# Patient Record
Sex: Male | Born: 1970 | Race: Black or African American | Hispanic: No | Marital: Married | Smoking: Current some day smoker
Health system: Southern US, Community
[De-identification: ages and names within clinical notes are randomized; demographics above are authoritative.]

## PROBLEM LIST (undated history)

## (undated) DIAGNOSIS — E785 Hyperlipidemia, unspecified: Secondary | ICD-10-CM

## (undated) DIAGNOSIS — G4710 Hypersomnia, unspecified: Secondary | ICD-10-CM

## (undated) DIAGNOSIS — J45909 Unspecified asthma, uncomplicated: Secondary | ICD-10-CM

## (undated) HISTORY — DX: Hyperlipidemia, unspecified: E78.5

## (undated) HISTORY — DX: Hypersomnia, unspecified: G47.10

## (undated) HISTORY — PX: APPENDECTOMY: SHX54

---

## 2002-11-19 ENCOUNTER — Emergency Department (HOSPITAL_COMMUNITY): Admission: EM | Admit: 2002-11-19 | Discharge: 2002-11-19 | Payer: Self-pay

## 2002-11-19 ENCOUNTER — Encounter: Payer: Self-pay | Admitting: *Deleted

## 2003-03-15 ENCOUNTER — Emergency Department (HOSPITAL_COMMUNITY): Admission: EM | Admit: 2003-03-15 | Discharge: 2003-03-15 | Payer: Self-pay | Admitting: Emergency Medicine

## 2004-01-22 ENCOUNTER — Emergency Department (HOSPITAL_COMMUNITY): Admission: EM | Admit: 2004-01-22 | Discharge: 2004-01-22 | Payer: Self-pay | Admitting: Emergency Medicine

## 2005-04-21 ENCOUNTER — Ambulatory Visit (HOSPITAL_BASED_OUTPATIENT_CLINIC_OR_DEPARTMENT_OTHER): Admission: RE | Admit: 2005-04-21 | Discharge: 2005-04-21 | Payer: Self-pay | Admitting: Urology

## 2005-04-21 ENCOUNTER — Encounter (INDEPENDENT_AMBULATORY_CARE_PROVIDER_SITE_OTHER): Payer: Self-pay | Admitting: Specialist

## 2005-10-04 ENCOUNTER — Ambulatory Visit: Payer: Self-pay | Admitting: Internal Medicine

## 2005-10-04 ENCOUNTER — Observation Stay (HOSPITAL_COMMUNITY): Admission: EM | Admit: 2005-10-04 | Discharge: 2005-10-06 | Payer: Self-pay | Admitting: Emergency Medicine

## 2005-10-05 ENCOUNTER — Encounter (INDEPENDENT_AMBULATORY_CARE_PROVIDER_SITE_OTHER): Payer: Self-pay | Admitting: Specialist

## 2006-05-07 ENCOUNTER — Emergency Department (HOSPITAL_COMMUNITY): Admission: EM | Admit: 2006-05-07 | Discharge: 2006-05-07 | Payer: Self-pay | Admitting: *Deleted

## 2006-06-29 ENCOUNTER — Emergency Department (HOSPITAL_COMMUNITY): Admission: EM | Admit: 2006-06-29 | Discharge: 2006-06-29 | Payer: Self-pay | Admitting: Emergency Medicine

## 2009-06-19 ENCOUNTER — Emergency Department (HOSPITAL_COMMUNITY): Admission: EM | Admit: 2009-06-19 | Discharge: 2009-06-19 | Payer: Self-pay | Admitting: Emergency Medicine

## 2009-08-27 ENCOUNTER — Emergency Department (HOSPITAL_COMMUNITY): Admission: EM | Admit: 2009-08-27 | Discharge: 2009-08-27 | Payer: Self-pay | Admitting: Emergency Medicine

## 2009-11-08 ENCOUNTER — Emergency Department (HOSPITAL_COMMUNITY): Admission: EM | Admit: 2009-11-08 | Discharge: 2009-11-08 | Payer: Self-pay | Admitting: Emergency Medicine

## 2010-10-28 NOTE — Consult Note (Signed)
NAME:  Nicholas Barajas, Nicholas Barajas NO.:  [id number]   MEDICAL RECORD NO.:  [id number]          PATIENT TYPE:  OBV   LOCATION:  3728                         FACILITY:  [facility]   PHYSICIAN:  Graylin Shiver, M.D.   DATE OF BIRTH:  03/11/1971   DATE OF CONSULTATION:  10/04/2005  DATE OF DISCHARGE:                                   CONSULTATION   REASON FOR CONSULTATION:  The patient is a 40 year old African-American male  who presents to the emergency room with complaints of rectal bleeding.  He  states he has been experiencing rectal bleeding for the past 2 years.  It is  been a dark red.  Over the last couple of days, the rectal bleeding has  increased and it worried him.  He came to the emergency room for further  evaluation.  He was seen and is going to be admitted.  His hemoglobin is  14.9.  He has no complaints of hematemesis.  He does have a little  indigestion and heartburn sometimes.  He has no history of peptic ulcer  disease.  No history of colitis.  He has never had his colon checked before.  He has just put up with this rectal bleeding over the past couple of years.   PAST HISTORY:  Medical problems asthma.   SURGERIES:  None.   ALLERGIES:  NONE KNOWN.   MEDICATIONS:  Some type of asthma medications.   SOCIAL HISTORY:  Smokes cigarettes, drinks alcohol.   REVIEW OF SYSTEMS:  No chest pain, no complaints of shortness of breath at  this time.   PHYSICAL:  VITAL SIGNS:  Stable.  No distress, nonicteric.  HEART:  Regular rhythm.  LUNGS:  Clear.  ABDOMEN:  Soft, nontender, no hepatosplenomegaly   IMPRESSION:  A 40 year old male with chronic rectal bleeding.  Hemoglobin is  normal.  Suspect he might have underlying inflammatory bowel disease such as  ulcerative proctitis or colitis.   PLAN:  Proceed with colonoscopy tomorrow to further evaluate.           ______________________________  Graylin Shiver, M.D.     SFG/MEDQ  D:  10/04/2005  T:  10/05/2005   Job:  [id number]

## 2010-10-28 NOTE — Op Note (Signed)
NAME:  Nicholas Barajas, Nicholas Barajas            ACCOUNT NO.:  [id number]   MEDICAL RECORD NO.:  [id number]          PATIENT TYPE:  AMB   LOCATION:  [facility]                         FACILITY:  [facility]   PHYSICIAN:  Sigmund I. Patsi Sears, M.D.DATE OF BIRTH:  1971/04/25   DATE OF PROCEDURE:  04/21/2005  DATE OF DISCHARGE:                                 OPERATIVE REPORT   PREOPERATIVE DIAGNOSIS:  Multiple recurrent penile warts.   POSTOPERATIVE DIAGNOSIS:  Multiple recurrent penile warts.   OPERATION:  Excision and CO2 laser of penile warts.   SURGEON:  Sigmund I. Patsi Sears, M.D.   ANESTHESIA:  General LMA.   PREPARATION:  After appropriate preanesthesia, the patient was brought to  the operating room, placed on the operating table in dorsal supine position  where general LMA anesthesia was introduced. The penis was prepped and  draped in the usual fashion.   Excision of the penile warts was then accomplished from the distal penile  shaft, and from the ventral penile shaft. The areas were then lased with a  CO2 laser. The wounds were then closed in one and two layers with 4-0  Monocryl suture. The patient tolerated the procedure well. A sterile  dressing was applied and the patient was awakened and taken to the recovery  room in good condition.      Sigmund I. Patsi Sears, M.D.  Electronically Signed     SIT/MEDQ  D:  04/21/2005  T:  04/22/2005  Job:  [id number]

## 2010-10-28 NOTE — Discharge Summary (Signed)
NAME:  Nicholas Barajas, Nicholas Barajas NO.:  [id number]   MEDICAL RECORD NO.:  [id number]          PATIENT TYPE:  OBV   LOCATION:  3728                         FACILITY:  [facility]   PHYSICIAN:  Ellie Lunch, M.D.      DATE OF BIRTH:  [birth year]   DATE OF ADMISSION:  10/04/2005  DATE OF DISCHARGE:  10/06/2005                                 DISCHARGE SUMMARY   RESIDENT:  Ellie Lunch, M.D.   He will be following up with Dr. Evette Cristal, a [facility] physician, and the  patient has no primary care physician.   DISCHARGE DIAGNOSIS:  Internal hemorrhoid.   DISCHARGE MEDICATIONS:  1.  Anusol HC b.i.d.  2.  Albuterol inhaler one to two puffs p.r.n.  3.  Metamucil OTC.   FOLLOWUP:  They will need to check if the patient is still bleeding after  three to four weeks after discharge, and if this is the case, he has been  instructed by his GI physician, Dr. Evette Cristal, to be referred to a surgeon to  repair his internal hemorrhoids.  His hemoglobin should also be checked to  see if it has decreased with continued bleeding.   PROCEDURES PERFORMED IN HOSPITAL:  Colonoscopy.  The results of this  colonoscopy including a diagnosis of an internal hemorrhoid, otherwise, the  colonoscopy was normal.  A biopsy was also taken from his rectum.  The  biopsy results will be followed up with the patient by the GI physician, Dr.  Evette Cristal.   CONSULTATIONS:  [facility] physician, Dr. Evette Cristal.   HISTORY OF PRESENT ILLNESS:  This 40 year old African-American male  presented to the ER with a long-standing history of rectal bleeding,  constipation, diarrhea, and abdominal cramping with a one month history of  increased bleeding and diarrhea, weakness, and lightheadedness.  He states  that the blood now fills the toilet bowl every time he uses the restroom,  and has diarrhea after every meal.  He has had similar episodes in the past,  but they are becoming more frequent.  He states that 1-1/2 years ago he  states he had a  two month episode similar to this one with frequent bloody  diarrhea, abdominal cramping, and decreased appetite.  He lost approximately  40 pounds at that time that he has slowly regained to a weight of  approximately 200 pounds.  He reports that his fatigue is getting worse and  feels that he needs to sleep a lot more which is unusual and frustrating to  the patient because he has always been athletic and enjoys physical  activity.  The episodes of severe bleeding and diarrhea have occurred  intermittently for at least the past three years, and have become increasing  in frequency.  There has been no period of time when there was not at least  some blood on the toilet paper.  He also has abdominal cramping which can be  severe at times.  He does report he has felt more short of breath, but is  unsure if that is due to __________.  He also states he has swelling and  pain  in both of his feet for a month or so.  Also very significant, is that  each time he stools he feels something inside of him that comes out and  gets stuck, which he then must use his finger to push back in.   ALLERGIES:  No known drug allergies.   PAST MEDICAL HISTORY:  1.  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  2.  He states he also had severe constipation while growing up since he was      a baby.  That was treated with enemas until age 65.  He then managed his      constipation through his 58s with some difficulty.  He had mild bleeding      during this entire period of his life.  For the past two years, the      problems have been with increased diarrhea, compared to constipation,      and with increased blood.  3.  Asthma.  4.  Appendectomy in his early teens.  5.  Inguinal hernia non-surgically reversed in 1994.  6.  Hospitalization in 1992, in [place] for approximately one month with      persistent high fever, nausea, and vomiting.  He states he had multiple      tests, but did  not have a colonoscopy during that admission.   MEDICATIONS:  His only regular medication includes albuterol for acute  asthma that he uses approximately two times per month unless he has an acute  exacerbation which he did have two weeks prior to admission.   SOCIAL HISTORY:  He is a current smoker.  He smokes five filtered cigars per  day and has for the past four years.  Prior to that time, he smoked  cigarettes occasionally.  He drinks one to two alcoholic beverages every day  with his maximum drink in one day in the past month being approximately  eight drinks.  He does not use cocaine, he does not use IV drugs.  He is  married, and works as a Merchandiser, retail.  He has health insurance with [employer].  He lives with his wife and three children.   FAMILY HISTORY:  His mother had ovarian cancer and hypertension.  She is  living.  His father had asthma and died of a MI at age 60.  His aunt had  ulcerative colitis.  His siblings include one healthy brother.  His  grandmother had diabetes mellitus.   REVIEW OF SYSTEMS:  Significant for weight loss, fatigue, chest pain with  his asthma, and shortness of breath with his asthma.  He also snores very  loudly.  He has had diarrhea and constipation, blood in his stool, as well  as black stools.  He has had decreased appetite and abdominal pain which he  describes is all over.  He also has joint pain in his feet.  He has had  weakness and imbalance, but he thinks is due to lightheadedness.   PHYSICAL EXAMINATION:  VITAL SIGNS:  Temperature of 97.9, blood pressure of  146/95, pulse 78, respirations 20, oxygen saturation 97% on room air.  His  weight was 201.  LUNGS:  Clear to auscultation bilaterally.  CARDIOVASCULAR:  Regular rate and rhythm with no murmurs, rubs, or gallops.  Radial, popliteal, dorsalis pedis pulses 2+.  Capillary refill was brisk, no  signs of pallor. ABDOMEN:  Soft, nondistended.  Tender below the umbilicus with hypoactive   bowel sounds.  RECTAL:  Significant for no fissures.  There was tenderness with deep  palpation.  An appendectomy scar was noted.  EXTREMITIES:  His feet were not swollen or tender on examination.   LABORATORY DATA:  Sodium of 138, potassium 3.4, chloride 107, bicarbonate  27, BUN 7, creatinine 1, glucose 92.  Hemoglobin 14.9, white blood cell  count 6.4, platelets 177.  ESR of 3.  MCV of 86.  Albumin of 3.5.  PT of  12.6, INR of 0.9, PTT of 29.  His total bilirubin was 0.7, alkaline  phosphatase 69, AST 23, ALT 21, total protein 6.2, albumin 3.5, calcium 8.6,  magnesium of 2.2.  His ferritin was 101.  His urinalysis was within normal  limits.  His fecal occult blood test was positive.   HOSPITAL COURSE:  PROBLEM #1 -  RECTAL BLEEDING:  The patient was admitted  to a telemetry bed and started on two large bore IV and given fluids of  normal saline at 125 cc/hour.  He also received CBC's q.8h. to monitor for  blood loss.  GI was consulted, and [facility] physician, Dr. Evette Cristal, saw the  patient and immediately recommended that the patient have a bowel prep so  that he could have a colonoscopy the morning after admission.  On October 05, 2005, the patient received a colonoscopy.  The results of the colonoscopy  included a diagnosis of an internal hemorrhoid, and otherwise normal colon,  and a biopsy was taken of his rectum.  The patient was continued to be  monitored with q.8h. CBC's and his hemoglobin was stable throughout  admission with admission hemoglobin of 14.9, and 15.3, 15.1, 14.3, 14.7, so  the patient had a stable hemoglobin throughout his admission.  The morning  of October 06, 2005, the patient was deemed to be stable.  Dr. Evette Cristal requested  to see the patient in followup in three weeks from discharge.   DISCHARGE LABORATORY DATA:  White blood count of 6.1, hemoglobin of 14.3,  hematocrit of 41.9, MCV of 89, platelet count of 173.  Neutrophils 49,  lymphocytes 36, monocytes 5,  eosinophils 9, basophils 1.     ______________________________  Lindaann Slough    ______________________________  Ellie Lunch, M.D.    KA/MEDQ  D:  10/06/2005  T:  10/07/2005  Job:  [id number]

## 2010-10-28 NOTE — Op Note (Signed)
NAME:  Nicholas Barajas, SHEVLIN NO.:  [id number]   MEDICAL RECORD NO.:  [id number]          PATIENT TYPE:  OBV   LOCATION:  3728                         FACILITY:  [facility]   PHYSICIAN:  Graylin Shiver, M.D.   DATE OF BIRTH:  04/28/1971   DATE OF PROCEDURE:  10/05/2005  DATE OF DISCHARGE:                                 OPERATIVE REPORT   INDICATIONS FOR PROCEDURE:  Diarrhea, rectal bleeding.   Informed consent was obtained after explanation of the risks of bleeding,  infection and perforation.   PREMEDICATION:  See medication sheet.   PROCEDURE:  With the patient in the left lateral decubitus position, a  rectal exam was performed.  No masses were felt.  The Olympus colonoscope  was inserted into the rectum and advanced around the colon to the cecum.  Cecal landmarks were identified. The cecum and ascending colon were normal.  The transverse colon normal.  The descending, sigmoid and rectum were  normal.  The scope was retroflexed in the rectum, and I saw an internal  hemorrhoid. The scope was then straightened, and a couple of biopsies from  the rectum were obtained for histology  because of the history of diarrhea.  He tolerated the procedure well without complications.   IMPRESSION:  Internal hemorrhoids which I believe is the cause of bleeding.  Biopsies of the rectum will be checked to see if anything shows up that  might explain his diarrhea.           ______________________________  Graylin Shiver, M.D.     SFG/MEDQ  D:  10/05/2005  T:  10/06/2005  Job:  [id number]   cc:   Madaline Guthrie, M.D.  Fax: [phone]

## 2012-04-11 ENCOUNTER — Other Ambulatory Visit: Payer: Self-pay | Admitting: Physician Assistant

## 2012-04-11 ENCOUNTER — Ambulatory Visit
Admission: RE | Admit: 2012-04-11 | Discharge: 2012-04-11 | Disposition: A | Discharge status: Home / Self Care | Payer: Worker's Compensation | Source: Ambulatory Visit | Attending: Physician Assistant | Admitting: Physician Assistant

## 2012-04-11 DIAGNOSIS — M545 Low back pain: Secondary | ICD-10-CM

## 2013-01-06 ENCOUNTER — Emergency Department (HOSPITAL_COMMUNITY): Payer: Self-pay

## 2013-01-06 ENCOUNTER — Emergency Department (HOSPITAL_COMMUNITY)
Admission: EM | Admit: 2013-01-06 | Discharge: 2013-01-07 | Disposition: A | Discharge status: Home / Self Care | Payer: Self-pay | Attending: Emergency Medicine | Admitting: Emergency Medicine

## 2013-01-06 ENCOUNTER — Encounter (HOSPITAL_COMMUNITY): Payer: Self-pay | Admitting: *Deleted

## 2013-01-06 DIAGNOSIS — M79672 Pain in left foot: Secondary | ICD-10-CM

## 2013-01-06 DIAGNOSIS — M25579 Pain in unspecified ankle and joints of unspecified foot: Secondary | ICD-10-CM | POA: Insufficient documentation

## 2013-01-06 DIAGNOSIS — Z79899 Other long term (current) drug therapy: Secondary | ICD-10-CM | POA: Insufficient documentation

## 2013-01-06 DIAGNOSIS — J45909 Unspecified asthma, uncomplicated: Secondary | ICD-10-CM | POA: Insufficient documentation

## 2013-01-06 HISTORY — DX: Unspecified asthma, uncomplicated: J45.909

## 2013-01-06 NOTE — ED Notes (Signed)
Pt is unable to bear his full body weight on his left foot. Pt denies any numbness and tingling. Pt states he has full sensation in his left foot.

## 2013-01-06 NOTE — ED Notes (Signed)
Pt in c/o pain to left foot, states pain is intermittent and it feels like a needle is sticking his foot, states the pain has been going on for a month

## 2013-01-06 NOTE — ED Provider Notes (Signed)
CSN: [id number]     Arrival date & time 01/06/13  1935 History    This chart was scribed for non-physician practitioner Junious Silk PA-C, working with Bonnita Levan. Bernette Mayers, MD by Donne Anon, ED Scribe. This patient was seen in room TR06C/TR06C and the patient's care was started at 2148.   First MD Initiated Contact with Patient 01/06/13 2148     Chief Complaint  Patient presents with  . Foot Pain    The history is provided by the patient. No language interpreter was used.   HPI Comments: Nicholas Barajas is a 42 y.o. male who presents to the Emergency Department complaining of 1 month of gradual onset, gradually worsening, intermittent left foot pain that worsened over the past week and is described as a sharp and stabbing. He has this pain about several times a day and it usually last for a few seconds. He is unsure what triggers the pain. He has tried Aleve with little relief. He states he injured his left ankle 7 years ago and never sought medical advice. At that time he twisted it and got a boot, which resolved his pain. He denies recent injury or trauma. He is concerned it may be DM but has never had his blood sugar measured before.     Past Medical History  Diagnosis Date  . Asthma    History reviewed. No pertinent past surgical history. History reviewed. No pertinent family history. History  Substance Use Topics  . Smoking status: Not on file  . Smokeless tobacco: Not on file  . Alcohol Use: Not on file    Review of Systems  Musculoskeletal: Positive for myalgias.  All other systems reviewed and are negative.    Allergies  Review of patient's allergies indicates no known allergies.  Home Medications   Current Outpatient Rx  Name  Route  Sig  Dispense  Refill  . albuterol (PROVENTIL HFA;VENTOLIN HFA) 108 (90 BASE) MCG/ACT inhaler   Inhalation   Inhale 2 puffs into the lungs every 6 (six) hours as needed for wheezing.         . carisoprodol (SOMA) 350 MG  tablet   Oral   Take 350 mg by mouth 4 (four) times daily as needed for muscle spasms.         . traMADol (ULTRAM) 50 MG tablet   Oral   Take 50 mg by mouth every 6 (six) hours as needed for pain.          BP 130/84  Pulse 66  Temp(Src) 98 F (36.7 C) (Oral)  Resp 18  SpO2 98%  Physical Exam  Nursing note and vitals reviewed. Constitutional: He is oriented to person, place, and time. He appears well-developed and well-nourished. No distress.  HENT:  Head: Normocephalic and atraumatic.  Right Ear: External ear normal.  Left Ear: External ear normal.  Nose: Nose normal.  Eyes: Conjunctivae are normal.  Neck: Normal range of motion. No tracheal deviation present.  Cardiovascular: Normal rate, regular rhythm, normal heart sounds and intact distal pulses.   Pulmonary/Chest: Effort normal and breath sounds normal. No stridor.  Abdominal: Soft. He exhibits no distension. There is no tenderness.  Musculoskeletal: Normal range of motion.  Strength normal. Neurovascularly intact. Capillary refill <3 seconds. No rashes. Entire right foot not tender to palpation.  Neurological: He is alert and oriented to person, place, and time.  Skin: Skin is warm and dry. He is not diaphoretic.  Psychiatric: He has a normal mood and affect.  His behavior is normal.    ED Course   Procedures (including critical care time) DIAGNOSTIC STUDIES: Oxygen Saturation is 98% on RA, normal by my interpretation.    COORDINATION OF CARE: 11:30 PM Discussed treatment plan which includes labs and foot xray with pt at bedside and pt agreed to plan. Will give PCP resource guide.    Labs Reviewed  POCT I-STAT, CHEM 8   Dg Foot Complete Left  01/07/2013   *RADIOLOGY REPORT*  Clinical Data: Foot pain.  LEFT FOOT - COMPLETE 3+ VIEW  Comparison: None.  Findings: Anatomic alignment bones of the left foot.  There is no fracture.  The soft tissues appear within normal limits.  Joint spaces preserved.  IMPRESSION:  Negative.   Original Report Authenticated By: Andreas Newport, M.D.   1. Foot pain, left     MDM  Patient with left foot pain x months. XR is negative. Chem 8 WNL. Compartment soft. Neurovascularly intact. He was given a resource guide to establish care with PCP. Return instructions given. Vital signs stable for discharge. Patient / Family / Caregiver informed of clinical course, understand medical decision-making process, and agree with plan.   I personally performed the services described in this documentation, which was scribed in my presence. The recorded information has been reviewed and is accurate.     Mora Bellman, PA-C 01/07/13 0127

## 2013-01-07 LAB — POCT I-STAT, CHEM 8
Chloride: 103 mEq/L (ref 96–112)
HCT: 44 % (ref 39.0–52.0)
Potassium: 3.5 mEq/L (ref 3.5–5.1)

## 2013-01-07 MED ORDER — PROMETHAZINE HCL 25 MG PO TABS: 25.0000 mg | ORAL_TABLET | Freq: Four times a day (QID) | ORAL | Status: DC | PRN

## 2013-01-07 MED ORDER — HYDROCODONE-ACETAMINOPHEN 5-325 MG PO TABS: 2.0000 | ORAL_TABLET | Freq: Four times a day (QID) | ORAL | Status: DC | PRN

## 2013-01-07 NOTE — ED Notes (Signed)
Phlebotomy at bedside.

## 2013-01-07 NOTE — ED Provider Notes (Signed)
Medical screening examination/treatment/procedure(s) were performed by non-physician practitioner and as supervising physician I was immediately available for consultation/collaboration.   Charles B. Sheldon, MD 01/07/13 0207 

## 2013-12-21 IMAGING — CR DG LUMBAR SPINE COMPLETE 4+V
5 series · 5 of 5 positions shown · non-contrast
Comparison: None.

CLINICAL DATA: Injured at work lifting heavy fracture, left-sided
back pain

LUMBAR SPINE - COMPLETE 4+ VIEW

[view not recorded (1 of 5)]
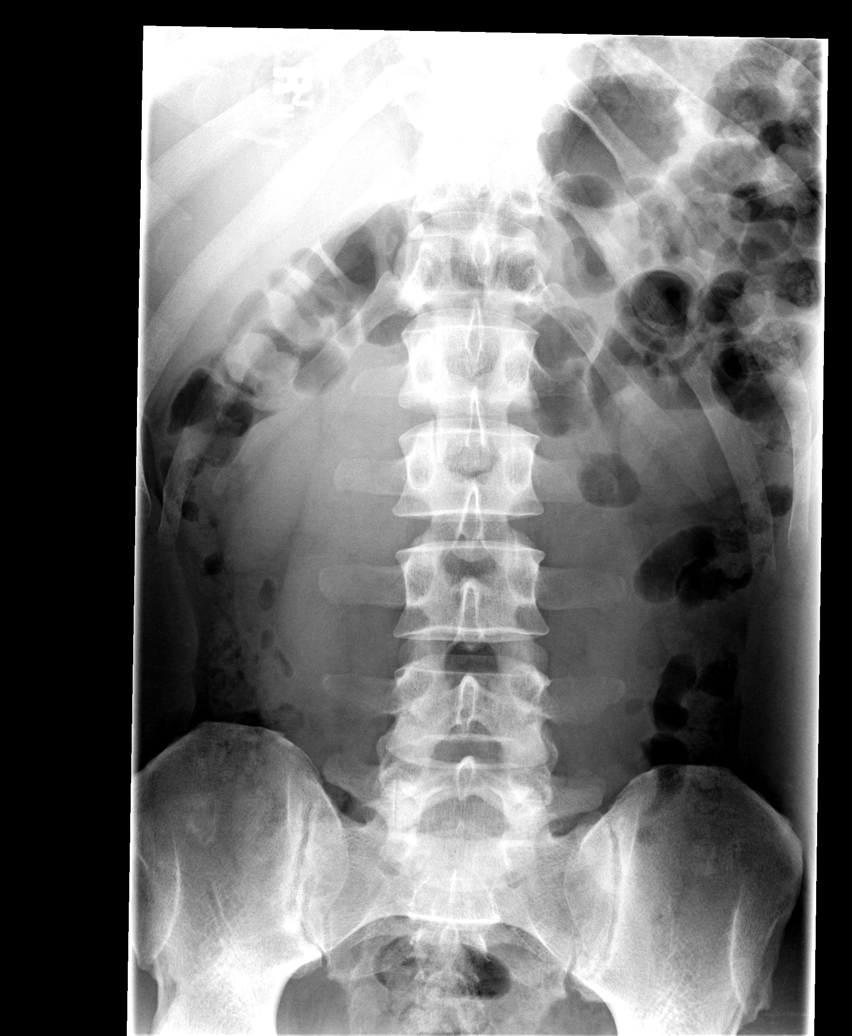

[view not recorded (2 of 5)]
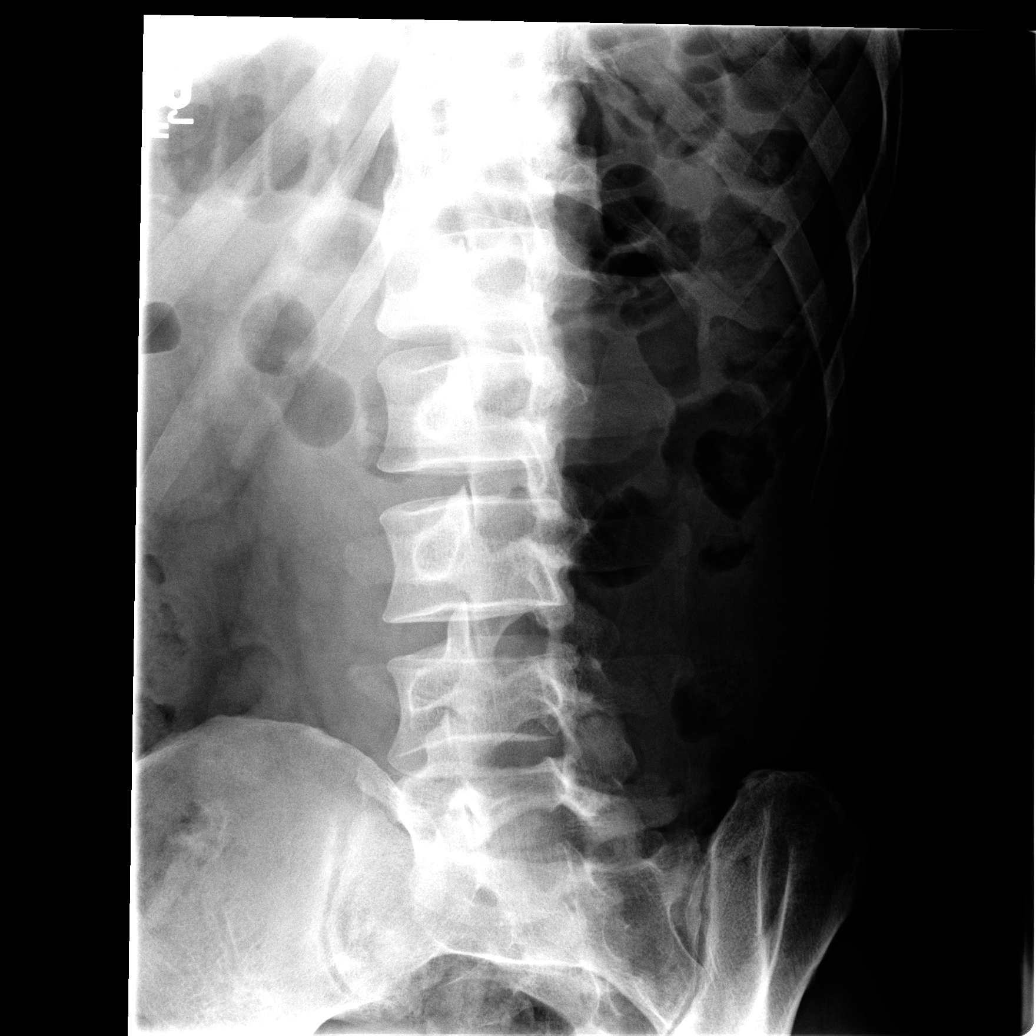

[view not recorded (3 of 5)]
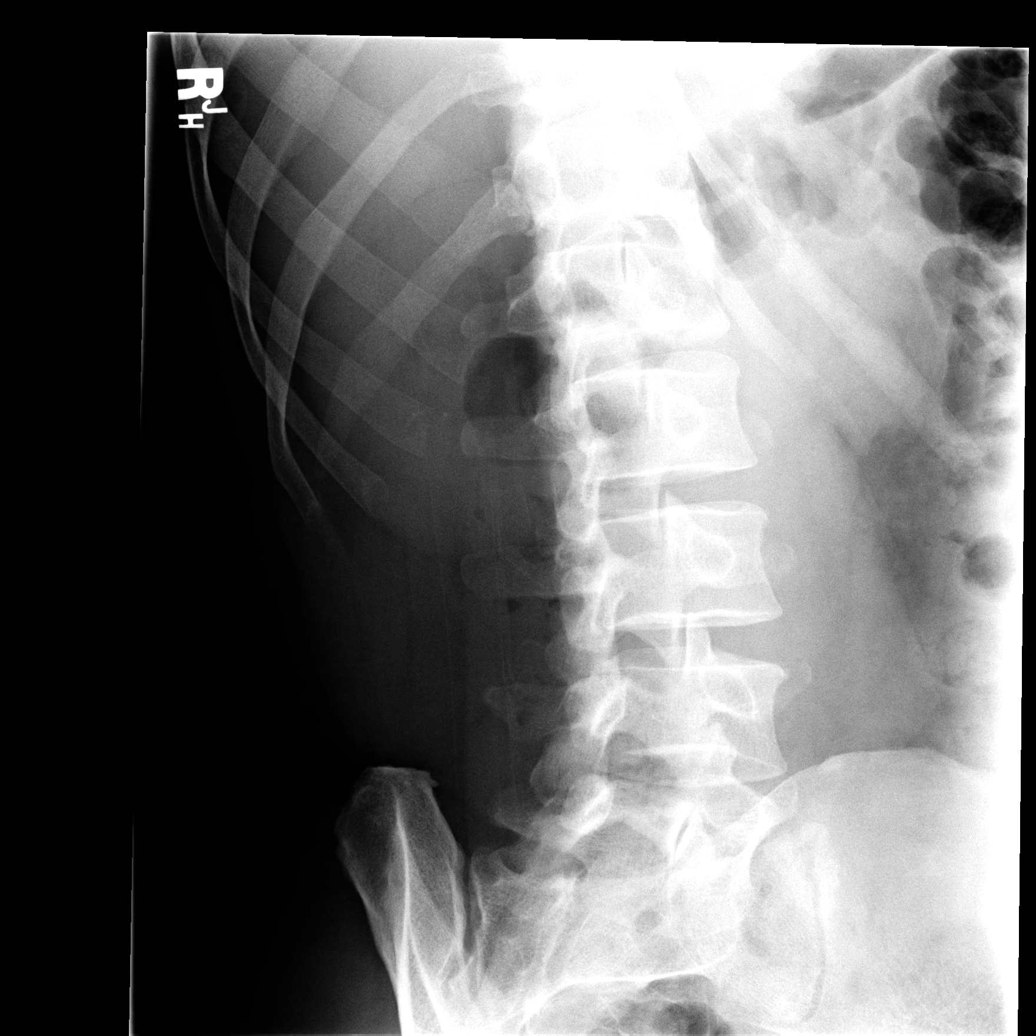

[view not recorded (4 of 5)]
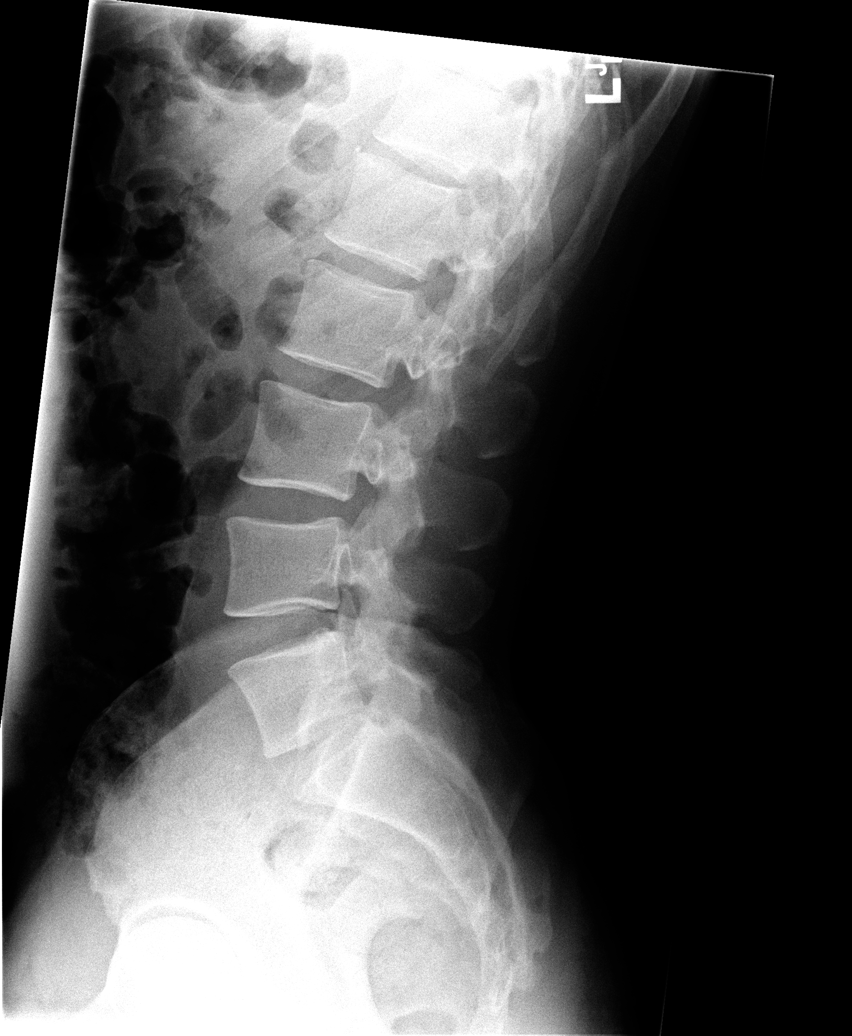

[view not recorded (5 of 5)]
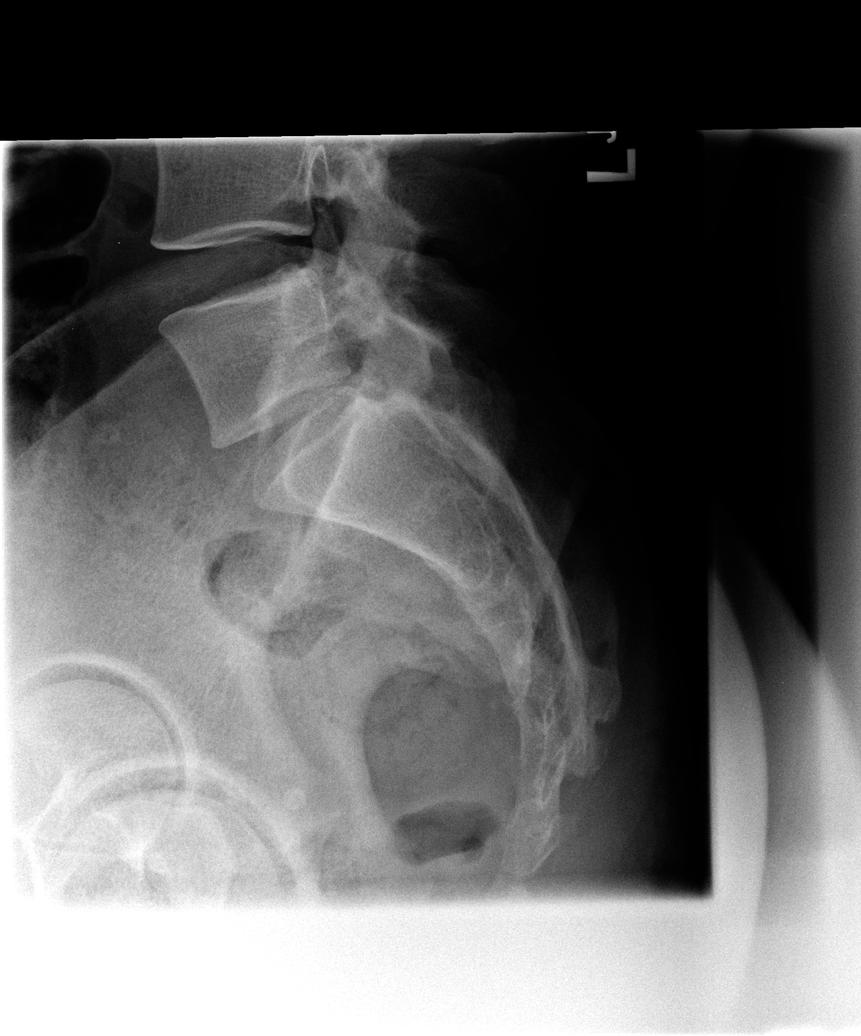

[5 of 5 positions shown; findings below may reference images not displayed]

FINDINGS: The lumbar vertebrae are in normal alignment.
Intervertebral disc spaces appear normal.  No degenerative change
is seen.  The SI joints appear normal.
IMPRESSION: Normal alignment.  Normal disc spaces.  No acute abnormality.

## 2015-02-01 ENCOUNTER — Encounter: Payer: Self-pay | Admitting: Neurology

## 2015-02-01 ENCOUNTER — Ambulatory Visit (INDEPENDENT_AMBULATORY_CARE_PROVIDER_SITE_OTHER): Payer: BLUE CROSS/BLUE SHIELD | Admitting: Neurology

## 2015-02-01 VITALS — BP 120/90 | HR 72 | Resp 18 | Ht 70.0 in | Wt 226.0 lb

## 2015-02-01 DIAGNOSIS — G4719 Other hypersomnia: Secondary | ICD-10-CM

## 2015-02-01 DIAGNOSIS — R51 Headache: Secondary | ICD-10-CM | POA: Diagnosis not present

## 2015-02-01 DIAGNOSIS — G2581 Restless legs syndrome: Secondary | ICD-10-CM

## 2015-02-01 DIAGNOSIS — R519 Headache, unspecified: Secondary | ICD-10-CM

## 2015-02-01 DIAGNOSIS — R351 Nocturia: Secondary | ICD-10-CM

## 2015-02-01 DIAGNOSIS — G4733 Obstructive sleep apnea (adult) (pediatric): Secondary | ICD-10-CM

## 2015-02-01 NOTE — Patient Instructions (Signed)

## 2015-02-01 NOTE — Progress Notes (Signed)
Subjective:    Patient ID: Nicholas Barajas is a 44 y.o. male.  HPI     Nicholas Barajas, Nicholas Barajas, Nicholas Barajas [facility] [address], [address] [address] [place], [place] [address]  Dear Nicholas Barajas,   I saw your patient, Nicholas Barajas, upon your kind request in my neurologic clinic today for initial consultation of his sleep disorder, in particular, concern for obstructive sleep apnea. The patient is unaccompanied today. As you know, Nicholas Barajas is a 44 year old right-handed gentleman with an underlying medical history of hypertension, smoking, and obesity, who reports snoring, witnessed apneas per wife, and excessive daytime somnolence. His Epworth sleepiness score is 16 out of 24 today, his fatigue scores 45 out of 63. He often has morning headaches which are dull and achy and in the front typically. This happens 3-4 times per week. He has nocturia typically once per night. He works 10:30 AM to 7 PM. His bedtime is around 11 PM and rise time is around 6 AM. He does not typically wake up rested. After his wife leaves for work he typically goes back to bed around 645 and gets up around 9. He works as a Scientist, clinical (histocompatibility and immunogenetics). He has been trying to lose weight and has cut down on his sodas. Currently he drinks one soda per day. He does not drink coffee. He drinks alcohol occasionally and smokes about 2 cigarettes per day. He has quit altogether in the past but continues to smoke a few cigarettes a day. He lives with his wife, 44 year old son and 65 year old daughter. His mother has a history of snoring. He also endorses occasional restless leg symptoms but is not sure if he twitches his legs in his sleep. Often he sleeps in a different room because of loud snoring and apneic pauses reported which disturb his wife's sleep.  His Past Medical History Is Significant For: Past Medical History  Diagnosis Date  . Asthma   . Hypersomnia   . Hyperlipemia     His Past Surgical History Is  Significant For: Past Surgical History  Procedure Laterality Date  . Appendectomy      His Family History Is Significant For: Family History  Problem Relation Age of Onset  . Heart attack Father     His Social History Is Significant For: Social History   Social History  . Marital Status: Married    Spouse Name: N/A  . Number of Children: 2  . Years of Education: HS   Occupational History  . [employer]     Social History Main Topics  . Smoking status: Current Some Day Smoker  . Smokeless tobacco: None  . Alcohol Use: 0.0 oz/week    0 Standard drinks or equivalent per week  . Drug Use: No  . Sexual Activity: Not Asked   Other Topics Concern  . None   Social History Narrative   Drinks about 1 soda a day     His Allergies Are:  No Known Allergies:   His Current Medications Are:  Outpatient Encounter Prescriptions as of 02/01/2015  Medication Sig  . modafinil (PROVIGIL) 200 MG tablet Take 200 mg by mouth every morning.   No facility-administered encounter medications on file as of 02/01/2015.  :  Review of Systems:  Out of a complete 14 point review of systems, all are reviewed and negative with the exception of these symptoms as listed below:   Review of Systems  Constitutional: Positive for fatigue.  Respiratory:  Snoring   Neurological:       No trouble falling or staying asleep, snoring, witnessed apnea, wakes up feeling tired, daytime tiredness, constant change in work schedule, morning headaches, denies taking naps.    Psychiatric/Behavioral:       Too much sleep, disinterest in activities    Objective:  Neurologic Exam  Physical Exam Physical Examination:   Filed Vitals:   02/01/15 0937  BP: 120/90  Pulse: 72  Resp: 18   General Examination: The patient is a very pleasant 44 y.o. male in no acute distress. He appears well-developed and well-nourished and well groomed.   HEENT: Normocephalic, atraumatic, pupils are equal,  round and reactive to light and accommodation. Funduscopic exam is normal with sharp disc margins noted. Extraocular tracking is good without limitation to gaze excursion or nystagmus noted. Normal smooth pursuit is noted. Hearing is grossly intact. Tympanic membranes are clear bilaterally. Face is symmetric with normal facial animation and normal facial sensation. Speech is clear with no dysarthria noted. There is no hypophonia. There is no lip, neck/head, jaw or voice tremor. Neck is supple with full range of passive and active motion. There are no carotid bruits on auscultation. Oropharynx exam reveals: mild mouth dryness, adequate dental hygiene and marked airway crowding, due to larger tongue, thicker soft palate with enlarged uvula, and large tonsils of about 3+. Mallampati is class II. Tongue protrudes centrally and palate elevates symmetrically. Neck size is 16 inches. He has a Mild overbite. Nasal inspection reveals  no significant nasal mucosal bogginess or redness and no septal deviation.   Chest: Clear to auscultation without wheezing, rhonchi or crackles noted.  Heart: S1+S2+0, regular and normal without murmurs, rubs or gallops noted.   Abdomen: Soft, non-tender and non-distended with normal bowel sounds appreciated on auscultation.  Extremities: There is no pitting edema in the distal lower extremities bilaterally. Pedal pulses are intact.  Skin: Warm and dry without trophic changes noted. There are no varicose veins.  Musculoskeletal: exam reveals no obvious joint deformities, tenderness or joint swelling or erythema.   Neurologically:  Mental status: The patient is awake, alert and oriented in all 4 spheres. His immediate and remote memory, attention, language skills and fund of knowledge are appropriate. There is no evidence of aphasia, agnosia, apraxia or anomia. Speech is clear with normal prosody and enunciation. Thought process is linear. Mood is normal and affect is normal.   Cranial nerves II - XII are as described above under HEENT exam. In addition: shoulder shrug is normal with equal shoulder height noted. Motor exam: Normal bulk, strength and tone is noted. There is no drift, tremor or rebound. Romberg is negative. Reflexes are 2+ throughout. Babinski: Toes are flexor bilaterally. Fine motor skills and coordination: intact with normal finger taps, normal hand movements, normal rapid alternating patting, normal foot taps and normal foot agility.  Cerebellar testing: No dysmetria or intention tremor on finger to nose testing. Heel to shin is unremarkable bilaterally. There is no truncal or gait ataxia.  Sensory exam: intact to light touch, pinprick, vibration, temperature sense in the upper and lower extremities.  Gait, station and balance: He stands easily. No veering to one side is noted. No leaning to one side is noted. Posture is age-appropriate and stance is narrow based. Gait shows normal stride length and normal pace. No problems turning are noted. He turns en bloc. Tandem walk is unremarkable.   Assessment and Plan:    In summary, Nicholas Barajas is a very pleasant  44 y.o.-year old male with an underlying medical history of hypertension, smoking, and obesity, who reports snoring, witnessed apneas per wife, and excessive daytime somnolence. In addition, he reports morning headaches, restless leg symptoms and nocturia. His history and physical exam are in keeping with obstructive sleep apnea (OSA). I had a long chat with the patient about my findings and the diagnosis of OSA, its prognosis and treatment options. We talked about medical treatments, surgical interventions and non-pharmacological approaches. I explained in particular the risks and ramifications of untreated moderate to severe OSA, especially with respect to developing cardiovascular disease down the Road, including congestive heart failure, difficult to treat hypertension, cardiac arrhythmias, or  stroke. Even type 2 diabetes has, in part, been linked to untreated OSA. Symptoms of untreated OSA include daytime sleepiness, memory problems, mood irritability and mood disorder such as depression and anxiety, lack of energy, as well as recurrent headaches, especially morning headaches. We talked about smoking cessation and trying to maintain a healthy lifestyle in general, as well as the importance of weight control. I encouraged the patient to eat healthy, exercise daily and keep well hydrated, to keep a scheduled bedtime and wake time routine, to not skip any meals and eat healthy snacks in between meals. I advised the patient not to drive when feeling sleepy. I recommended the following at this time: sleep study with potential positive airway pressure titration. (We will score hypopneas at 3% and split the sleep study into diagnostic and treatment portion, if the estimated. 2 hour AHI is >15/h).   I explained the sleep test procedure to the patient and also outlined possible surgical and non-surgical treatment options of OSA, including the use of a custom-made dental device (which would require a referral to a specialist dentist or oral surgeon), upper airway surgical options, such as pillar implants, radiofrequency surgery, tongue base surgery, and UPPP (which would involve a referral to an ENT surgeon). Rarely, jaw surgery such as mandibular advancement may be considered.  I also explained the CPAP treatment option to the patient, who indicated that he would be reluctant, willing to try CPAP if the need arises. I explained the importance of being compliant with PAP treatment, not only for insurance purposes but primarily to improve His symptoms, and for the patient's long term health benefit, including to reduce His cardiovascular risks. I answered all his questions today and the patient was in agreement. I would like to see him back after the sleep study is completed and encouraged him to call with any  interim questions, concerns, problems or updates.   Thank you very much for allowing me to participate in the care of this nice patient. If I can be of any further assistance to you please do not hesitate to call me at [phone].  Sincerely,   Nicholas Barajas, Nicholas Barajas, Nicholas Barajas

## 2015-03-19 ENCOUNTER — Ambulatory Visit (INDEPENDENT_AMBULATORY_CARE_PROVIDER_SITE_OTHER): Payer: BLUE CROSS/BLUE SHIELD | Admitting: Neurology

## 2015-03-19 DIAGNOSIS — G4733 Obstructive sleep apnea (adult) (pediatric): Secondary | ICD-10-CM | POA: Diagnosis not present

## 2015-03-19 NOTE — Sleep Study (Signed)
Please see the scanned sleep study interpretation located in the Procedure tab within the Chart Review section. 

## 2015-03-22 ENCOUNTER — Telehealth: Payer: Self-pay | Admitting: Neurology

## 2015-03-22 DIAGNOSIS — G4733 Obstructive sleep apnea (adult) (pediatric): Secondary | ICD-10-CM

## 2015-03-22 NOTE — Telephone Encounter (Signed)
Diana:   Patient referred by PCP, seen by me on 02/01/15, split study on 03/19/15, Ins: [employer].  Please call and notify patient that the recent sleep study confirmed the diagnosis of severe OSA. He did well with CPAP during the study with significant improvement of the respiratory events. Therefore, I would like start the patient on CPAP therapy at home by prescribing a machine for home use. I placed the order in the chart. The patient will need a follow up appointment with me in 8 to 10 weeks post set up that has to be scheduled; please go ahead and schedule while you have the patient on the phone and make sure patient understands the importance of keeping this window for the FU appointment, as it is often an insurance requirement and failing to adhere to this may result in losing coverage for sleep apnea treatment. 15 min follow-up should suffice, unless there is a 30 min FU slot available.  Please re-enforce the importance of compliance with treatment and the need for [place] to monitor compliance data - again an insurance requirement and good feedback for the patient as far as how they are doing.  Also remind patient, that any upcoming CPAP machine or mask issues, should be first addressed with the DME company. Please ask if patient has a preference regarding DME company.  Please arrange for CPAP set up at home through a DME company of patient's choice - once you have spoken to the patient - and faxed/routed report to PCP and referring MD (if other than PCP), you can close this encounter, thanks,   Huston Foley, MD, PhD [facility] ([facility])

## 2015-03-22 NOTE — Telephone Encounter (Signed)
I spoke to patient and gave him results. He would like to proceed with CPAP treatment. I will refer patient out. I will fax report to PCP. And send patient a letter reminding him to make appt and stress the importance of compliance.

## 2015-06-29 ENCOUNTER — Emergency Department (HOSPITAL_COMMUNITY)
Admission: EM | Admit: 2015-06-29 | Discharge: 2015-06-29 | Disposition: A | Discharge status: Home / Self Care | Payer: BLUE CROSS/BLUE SHIELD | Attending: Emergency Medicine | Admitting: Emergency Medicine

## 2015-06-29 ENCOUNTER — Encounter (HOSPITAL_COMMUNITY): Payer: Self-pay

## 2015-06-29 DIAGNOSIS — J039 Acute tonsillitis, unspecified: Secondary | ICD-10-CM | POA: Diagnosis not present

## 2015-06-29 DIAGNOSIS — F17200 Nicotine dependence, unspecified, uncomplicated: Secondary | ICD-10-CM | POA: Diagnosis not present

## 2015-06-29 DIAGNOSIS — Z79899 Other long term (current) drug therapy: Secondary | ICD-10-CM | POA: Insufficient documentation

## 2015-06-29 DIAGNOSIS — Z8639 Personal history of other endocrine, nutritional and metabolic disease: Secondary | ICD-10-CM | POA: Insufficient documentation

## 2015-06-29 DIAGNOSIS — G4710 Hypersomnia, unspecified: Secondary | ICD-10-CM | POA: Insufficient documentation

## 2015-06-29 DIAGNOSIS — J45909 Unspecified asthma, uncomplicated: Secondary | ICD-10-CM | POA: Diagnosis not present

## 2015-06-29 DIAGNOSIS — R221 Localized swelling, mass and lump, neck: Secondary | ICD-10-CM | POA: Diagnosis present

## 2015-06-29 LAB — RAPID STREP SCREEN (MED CTR MEBANE ONLY): Streptococcus, Group A Screen (Direct): NEGATIVE

## 2015-06-29 MED ORDER — DEXAMETHASONE 4 MG PO TABS
10.0000 mg | ORAL_TABLET | Freq: Once | ORAL | Status: AC
  Administered 2015-06-29: 10 mg via ORAL
  Filled 2015-06-29: qty 3

## 2015-06-29 MED ORDER — CEFTRIAXONE SODIUM 1 G IJ SOLR
1.0000 g | Freq: Once | INTRAMUSCULAR | Status: AC
  Administered 2015-06-29: 1 g via INTRAMUSCULAR
  Filled 2015-06-29: qty 10

## 2015-06-29 MED ORDER — AMOXICILLIN 500 MG PO CAPS: 500.0000 mg | ORAL_CAPSULE | Freq: Three times a day (TID) | ORAL | Status: AC

## 2015-06-29 MED ORDER — HYDROCODONE-ACETAMINOPHEN 5-325 MG PO TABS: 1.0000 | ORAL_TABLET | Freq: Four times a day (QID) | ORAL | Status: AC | PRN

## 2015-06-29 NOTE — Discharge Instructions (Signed)
Use salt water gargles and use Chloraseptic spray. Return if you have any difficulty swallowing or worsening symptoms.   Tonsillitis Tonsillitis is an infection of the throat that causes the tonsils to become red, tender, and swollen. Tonsils are collections of lymphoid tissue at the back of the throat. Each tonsil has crevices (crypts). Tonsils help fight nose and throat infections and keep infection from spreading to other parts of the body for the first 18 months of life.  CAUSES Sudden (acute) tonsillitis is usually caused by infection with streptococcal bacteria. Long-lasting (chronic) tonsillitis occurs when the crypts of the tonsils become filled with pieces of food and bacteria, which makes it easy for the tonsils to become repeatedly infected. SYMPTOMS  Symptoms of tonsillitis include:  A sore throat, with possible difficulty swallowing.  White patches on the tonsils.  Fever.  Tiredness.  New episodes of snoring during sleep, when you did not snore before.  Small, foul-smelling, yellowish-white pieces of material (tonsilloliths) that you occasionally cough up or spit out. The tonsilloliths can also cause you to have bad breath. DIAGNOSIS Tonsillitis can be diagnosed through a physical exam. Diagnosis can be confirmed with the results of lab tests, including a throat culture. TREATMENT  The goals of tonsillitis treatment include the reduction of the severity and duration of symptoms and prevention of associated conditions. Symptoms of tonsillitis can be improved with the use of steroids to reduce the swelling. Tonsillitis caused by bacteria can be treated with antibiotic medicines. Usually, treatment with antibiotic medicines is started before the cause of the tonsillitis is known. However, if it is determined that the cause is not bacterial, antibiotic medicines will not treat the tonsillitis. If attacks of tonsillitis are severe and frequent, your health care provider may recommend  surgery to remove the tonsils (tonsillectomy). HOME CARE INSTRUCTIONS   Rest as much as possible and get plenty of sleep.  Drink plenty of fluids. While the throat is very sore, eat soft foods or liquids, such as sherbet, soups, or instant breakfast drinks.  Eat frozen ice pops.  Gargle with a warm or cold liquid to help soothe the throat. Mix 1/4 teaspoon of salt and 1/4 teaspoon of baking soda in 8 oz of water. SEEK MEDICAL CARE IF:   Large, tender lumps develop in your neck.  A rash develops.  A green, yellow-brown, or bloody substance is coughed up.  You are unable to swallow liquids or food for 24 hours.  You notice that only one of the tonsils is swollen. SEEK IMMEDIATE MEDICAL CARE IF:   You develop any new symptoms such as vomiting, severe headache, stiff neck, chest pain, or trouble breathing or swallowing.  You have severe throat pain along with drooling or voice changes.  You have severe pain, unrelieved with recommended medications.  You are unable to fully open the mouth.  You develop redness, swelling, or severe pain anywhere in the neck.  You have a fever. MAKE SURE YOU:   Understand these instructions.  Will watch your condition.  Will get help right away if you are not doing well or get worse.   This information is not intended to replace advice given to you by your health care provider. Make sure you discuss any questions you have with your health care provider.   Document Released: 03/08/2005 Document Revised: 06/19/2014 Document Reviewed: 11/15/2012 Elsevier Interactive Patient Education [employer]

## 2015-06-29 NOTE — ED Provider Notes (Signed)
CSN: [id number]     Arrival date & time 06/29/15  1357 History  By signing my name below, I, Jarvis Morgan, attest that this documentation has been prepared under the direction and in the presence of Kerrie Buffalo, NP Electronically Signed: Jarvis Morgan, ED Scribe. 06/30/2015. 11:48 PM.      Chief Complaint  Patient presents with  . throat swelling    Patient is a 45 y.o. male presenting with pharyngitis. The history is provided by the patient. No language interpreter was used.  Sore Throat This is a new problem. The current episode started 2 days ago. The problem occurs rarely. The problem has been gradually improving. Pertinent negatives include no abdominal pain. The symptoms are aggravated by swallowing. Nothing relieves the symptoms. He has tried nothing for the symptoms.    HPI Comments: Nicholas Barajas is a 45 y.o. male with a h/o asthma who presents to the Emergency Department complaining of constant, moderate, gradually improving, sore throat for 2 days. He reports associated mild throat swelling and subjective fever. He notes he is able to handle his secretions without difficulty. Pt endorses he can speak without difficulty. Pt reports h/o strep in the past which was relieved with abx treatment. He is a current some day smoker. He denies any known medication allergies. Pt denies any known sick contacts. He denies any cough, otalgia, nausea, vomiting, abdominal pain, wheezing, or other associated symptoms at this time.   Past Medical History  Diagnosis Date  . Asthma   . Hypersomnia   . Hyperlipemia    Past Surgical History  Procedure Laterality Date  . Appendectomy     Family History  Problem Relation Age of Onset  . Heart attack Father    Social History  Substance Use Topics  . Smoking status: Current Some Day Smoker  . Smokeless tobacco: None  . Alcohol Use: 0.0 oz/week    0 Standard drinks or equivalent per week    Review of Systems  Constitutional: Positive for  fever (subjective) and chills.  HENT: Positive for sore throat. Negative for ear pain, trouble swallowing and voice change.   Respiratory: Negative for wheezing.   Gastrointestinal: Negative for nausea, vomiting and abdominal pain.  All other systems reviewed and are negative.     Allergies  Review of patient's allergies indicates no known allergies.  Home Medications   Prior to Admission medications   Medication Sig Start Date End Date Taking? Authorizing Provider  amoxicillin (AMOXIL) 500 MG capsule Take 1 capsule (500 mg total) by mouth 3 (three) times daily. 06/29/15   Hope Orlene Och, NP  HYDROcodone-acetaminophen (NORCO) 5-325 MG tablet Take 1 tablet by mouth every 6 (six) hours as needed. 06/29/15   Hope Orlene Och, NP  modafinil (PROVIGIL) 200 MG tablet Take 200 mg by mouth every morning. 01/14/15   Historical Provider, MD   BP 120/80 mmHg  Pulse 60  Temp(Src) 98.5 F (36.9 C) (Oral)  Resp 16  SpO2 98% Physical Exam  Constitutional: He is oriented to person, place, and time. He appears well-developed and well-nourished.  HENT:  Head: Normocephalic.  Right Ear: Tympanic membrane normal.  Left Ear: Tympanic membrane normal.  Mouth/Throat: Uvula is midline. Oropharyngeal exudate and posterior oropharyngeal erythema present. No tonsillar abscesses.  Tonsils enlarged with exudate bilateral (kissing tonsils)  Eyes: EOM are normal.  Neck: Neck supple.  Cardiovascular: Normal rate and regular rhythm.   Pulmonary/Chest: Effort normal and breath sounds normal.  Abdominal: Soft. There is no tenderness.  Musculoskeletal: Normal range of motion.  Lymphadenopathy:    He has cervical adenopathy (bilaterally).  Neurological: He is alert and oriented to person, place, and time. No cranial nerve deficit.  Skin: Skin is warm and dry.  Psychiatric: He has a normal mood and affect. His behavior is normal.  Nursing note and vitals reviewed.   ED Course  Procedures (including critical care  time)  DIAGNOSTIC STUDIES: Oxygen Saturation is 99% on RA, normal by my interpretation.    COORDINATION OF CARE: 2:15 PM- Will order rapid strep screen.  Pt advised of plan for treatment and pt agrees.  7:30 PM- Will order Rocephin injection. Pt advised of plan for treatment and pt agrees. Decadron 10 mg PO   MDM  45 y.o. male with sore throat x 2 days, gland swelling and enlarged tonsils with exudate stable for d/c without tonsillar abscess and no difficulty swallowing. He will continue antibiotics PO and I will treat the pain with hydrocodone. Discussed with the patient and all questioned fully answered. He will return if any problems arise.   Final diagnoses:  Tonsillitis   I personally performed the services described in this documentation, which was scribed in my presence. The recorded information has been reviewed and is accurate.     [place], NP 06/30/15 2352  Leta Baptist, MD 07/02/15 [phone]

## 2015-06-29 NOTE — ED Notes (Signed)
Patient here with sore throat and swelling x 2 days, handling secretions and speaking full sentences, reports eyes have had some swelling as well

## 2015-07-01 LAB — CULTURE, GROUP A STREP (THRC)

## 2015-09-27 ENCOUNTER — Emergency Department (HOSPITAL_COMMUNITY)
Admission: EM | Admit: 2015-09-27 | Discharge: 2015-09-27 | Disposition: A | Discharge status: Home / Self Care | Payer: BLUE CROSS/BLUE SHIELD | Attending: Emergency Medicine | Admitting: Emergency Medicine

## 2015-09-27 ENCOUNTER — Encounter (HOSPITAL_COMMUNITY): Payer: Self-pay | Admitting: Emergency Medicine

## 2015-09-27 DIAGNOSIS — Z8639 Personal history of other endocrine, nutritional and metabolic disease: Secondary | ICD-10-CM | POA: Insufficient documentation

## 2015-09-27 DIAGNOSIS — J069 Acute upper respiratory infection, unspecified: Secondary | ICD-10-CM

## 2015-09-27 DIAGNOSIS — Z8669 Personal history of other diseases of the nervous system and sense organs: Secondary | ICD-10-CM | POA: Diagnosis not present

## 2015-09-27 DIAGNOSIS — Z79899 Other long term (current) drug therapy: Secondary | ICD-10-CM | POA: Diagnosis not present

## 2015-09-27 DIAGNOSIS — J45901 Unspecified asthma with (acute) exacerbation: Secondary | ICD-10-CM | POA: Diagnosis not present

## 2015-09-27 DIAGNOSIS — Z792 Long term (current) use of antibiotics: Secondary | ICD-10-CM | POA: Diagnosis not present

## 2015-09-27 DIAGNOSIS — Z760 Encounter for issue of repeat prescription: Secondary | ICD-10-CM | POA: Diagnosis not present

## 2015-09-27 DIAGNOSIS — F17200 Nicotine dependence, unspecified, uncomplicated: Secondary | ICD-10-CM | POA: Insufficient documentation

## 2015-09-27 DIAGNOSIS — R05 Cough: Secondary | ICD-10-CM | POA: Diagnosis present

## 2015-09-27 MED ORDER — ALBUTEROL SULFATE (2.5 MG/3ML) 0.083% IN NEBU: 2.5000 mg | INHALATION_SOLUTION | Freq: Four times a day (QID) | RESPIRATORY_TRACT | Status: AC | PRN

## 2015-09-27 MED ORDER — ALBUTEROL SULFATE HFA 108 (90 BASE) MCG/ACT IN AERS: 1.0000 | INHALATION_SPRAY | Freq: Four times a day (QID) | RESPIRATORY_TRACT | Status: AC | PRN

## 2015-09-27 NOTE — Discharge Instructions (Signed)
Upper Respiratory Infection, Adult Most upper respiratory infections (URIs) are a viral infection of the air passages leading to the lungs. A URI affects the nose, throat, and upper air passages. The most common type of URI is nasopharyngitis and is typically referred to as "the common cold." URIs run their course and usually go away on their own. Most of the time, a URI does not require medical attention, but sometimes a bacterial infection in the upper airways can follow a viral infection. This is called a secondary infection. Sinus and middle ear infections are common types of secondary upper respiratory infections. Bacterial pneumonia can also complicate a URI. A URI can worsen asthma and chronic obstructive pulmonary disease (COPD). Sometimes, these complications can require emergency medical care and may be life threatening.  CAUSES Almost all URIs are caused by viruses. A virus is a type of germ and can spread from one person to another.  RISKS FACTORS You may be at risk for a URI if:   You smoke.   You have chronic heart or lung disease.  You have a weakened defense (immune) system.   You are very young or very old.   You have nasal allergies or asthma.  You work in crowded or poorly ventilated areas.  You work in health care facilities or schools. SIGNS AND SYMPTOMS  Symptoms typically develop 2-3 days after you come in contact with a cold virus. Most viral URIs last 7-10 days. However, viral URIs from the influenza virus (flu virus) can last 14-18 days and are typically more severe. Symptoms may include:   Runny or stuffy (congested) nose.   Sneezing.   Cough.   Sore throat.   Headache.   Fatigue.   Fever.   Loss of appetite.   Pain in your forehead, behind your eyes, and over your cheekbones (sinus pain).  Muscle aches.  DIAGNOSIS  Your health care provider may diagnose a URI by:  Physical exam.  Tests to check that your symptoms are not due to  another condition such as:  Strep throat.  Sinusitis.  Pneumonia.  Asthma. TREATMENT  A URI goes away on its own with time. It cannot be cured with medicines, but medicines may be prescribed or recommended to relieve symptoms. Medicines may help:  Reduce your fever.  Reduce your cough.  Relieve nasal congestion. HOME CARE INSTRUCTIONS   Take medicines only as directed by your health care provider.   Gargle warm saltwater or take cough drops to comfort your throat as directed by your health care provider.  Use a warm mist humidifier or inhale steam from a shower to increase air moisture. This may make it easier to breathe.  Drink enough fluid to keep your urine clear or pale yellow.   Eat soups and other clear broths and maintain good nutrition.   Rest as needed.   Return to work when your temperature has returned to normal or as your health care provider advises. You may need to stay home longer to avoid infecting others. You can also use a face mask and careful hand washing to prevent spread of the virus.  Increase the usage of your inhaler if you have asthma.   Do not use any tobacco products, including cigarettes, chewing tobacco, or electronic cigarettes. If you need help quitting, ask your health care provider. PREVENTION  The best way to protect yourself from getting a cold is to practice good hygiene.   Avoid oral or hand contact with people with cold   symptoms.   Wash your hands often if contact occurs.  There is no clear evidence that vitamin C, vitamin E, echinacea, or exercise reduces the chance of developing a cold. However, it is always recommended to get plenty of rest, exercise, and practice good nutrition.  SEEK MEDICAL CARE IF:   You are getting worse rather than better.   Your symptoms are not controlled by medicine.   You have chills.  You have worsening shortness of breath.  You have brown or red mucus.  You have yellow or brown nasal  discharge.  You have pain in your face, especially when you bend forward.  You have a fever.  You have swollen neck glands.  You have pain while swallowing.  You have white areas in the back of your throat. SEEK IMMEDIATE MEDICAL CARE IF:   You have severe or persistent:  Headache.  Ear pain.  Sinus pain.  Chest pain.  You have chronic lung disease and any of the following:  Wheezing.  Prolonged cough.  Coughing up blood.  A change in your usual mucus.  You have a stiff neck.  You have changes in your:  Vision.  Hearing.  Thinking.  Mood. MAKE SURE YOU:   Understand these instructions.  Will watch your condition.  Will get help right away if you are not doing well or get worse.   This information is not intended to replace advice given to you by your health care provider. Make sure you discuss any questions you have with your health care provider.   Document Released: 11/22/2000 Document Revised: 10/13/2014 Document Reviewed: 09/03/2013 Elsevier Interactive Patient Education 2016 [employer]  

## 2015-09-27 NOTE — ED Provider Notes (Signed)
CSN: [id number]     Arrival date & time 09/27/15  1313 History  By signing my name below, I, Placido SouLogan Joldersma, attest that this documentation has been prepared under the direction and in the presence of Newell RubbermaidJeffrey Laniah Grimm, PA-C. Electronically Signed: Placido SouLogan Joldersma, ED Scribe. 09/27/2015. 3:08 PM.   Chief Complaint  Patient presents with  . Asthma  . Medication Refill   The history is provided by the patient. No language interpreter was used.    HPI Comments:  Nicholas Barajas is a 45 y.o. male with a PMHx of asthma who presents to the Emergency Department complaining of constant, mild, productive cough x 2 days. He reports associated, mild, intermittent, wheezing, rhinorrhea, nocturnal hyperhidrosis, subjective fevers, chest congestion and intermittent, mild, SOB. He confirms his SOB and wheezing are consistent with past asthma exacerbations. Pt notes using an albuterol nebulizer as needed with his last dose 1 day ago before running out and additionally requests a refill. He reports a hx of smoking. He denies CP or any other associated symptoms at this time.    Past Medical History  Diagnosis Date  . Asthma   . Hypersomnia   . Hyperlipemia    Past Surgical History  Procedure Laterality Date  . Appendectomy     Family History  Problem Relation Age of Onset  . Heart attack Father    Social History  Substance Use Topics  . Smoking status: Current Some Day Smoker  . Smokeless tobacco: None  . Alcohol Use: 0.0 oz/week    0 Standard drinks or equivalent per week    Review of Systems A complete 10 system review of systems was obtained and all systems are negative except as noted in the HPI and PMH.   Allergies  Review of patient's allergies indicates no known allergies.  Home Medications   Prior to Admission medications   Medication Sig Start Date End Date Taking? Authorizing Provider  albuterol (PROVENTIL HFA;VENTOLIN HFA) 108 (90 Base) MCG/ACT inhaler Inhale 1-2 puffs into the  lungs every 6 (six) hours as needed for wheezing or shortness of breath. 09/27/15   Eyvonne MechanicJeffrey Madai Nuccio, PA-C  albuterol (PROVENTIL) (2.5 MG/3ML) 0.083% nebulizer solution Take 3 mLs (2.5 mg total) by nebulization every 6 (six) hours as needed for wheezing or shortness of breath. 09/27/15   Eyvonne MechanicJeffrey Olympia Adelsberger, PA-C  amoxicillin (AMOXIL) 500 MG capsule Take 1 capsule (500 mg total) by mouth 3 (three) times daily. 06/29/15   Hope Orlene OchM Neese, NP  HYDROcodone-acetaminophen (NORCO) 5-325 MG tablet Take 1 tablet by mouth every 6 (six) hours as needed. 06/29/15   Hope Orlene OchM Neese, NP  modafinil (PROVIGIL) 200 MG tablet Take 200 mg by mouth every morning. 01/14/15   Historical Provider, MD   BP 130/95 mmHg  Pulse 77  Temp(Src) 98.2 F (36.8 C) (Oral)  Resp 20  SpO2 96%    Physical Exam  Constitutional: He is oriented to person, place, and time. He appears well-developed and well-nourished.  HENT:  Head: Normocephalic and atraumatic.  Right Ear: Tympanic membrane, external ear and ear canal normal.  Left Ear: Tympanic membrane, external ear and ear canal normal.  Mouth/Throat: Uvula is midline and mucous membranes are normal. No oropharyngeal exudate or posterior oropharyngeal erythema.  Mild bilateral tonsillar edema   Eyes: EOM are normal.  Neck: Normal range of motion.  Cardiovascular: Normal rate.   Pulmonary/Chest: Effort normal. No respiratory distress. He has wheezes.  Abdominal: Soft.  Musculoskeletal: Normal range of motion.  Neurological: He is alert and  oriented to person, place, and time.  Skin: Skin is warm and dry.  Psychiatric: He has a normal mood and affect.  Nursing note and vitals reviewed.   ED Course  Procedures  DIAGNOSTIC STUDIES: Oxygen Saturation is 96% on RA, normal by my interpretation.    COORDINATION OF CARE: 3:06 PM Discussed next steps with pt. He verbalized understanding and is agreeable with the plan.   Labs Review Labs Reviewed - No data to display  Imaging Review No  results found.   EKG Interpretation None      MDM   Final diagnoses:  URI (upper respiratory infection)    Labs: none indicated  Imaging: none indicated  Consults: none  Therapeutics: Proventil inhaler; Proventil 0.083% nebulizer solution  Assessment:  45 year old male presents today for refill of her asthma medication. Patient has a history of asthma, likely he had a viral upper respiratory infection that has been improving. Patient had clear lung sounds here, was afebrile, nontoxic in no acute distress. Patient will be given albuterol prescription, and encouraged follow-up with his primary care for reevaluation.   Plan: Pt given strict return precautions, verbalized understanding and agreement to today's plan and had no further questions or concerns at the time of discharge.       Eyvonne Mechanic, PA-C 09/27/15 1705  Lorre Nick, MD 10/01/15 [phone]

## 2015-09-27 NOTE — ED Notes (Signed)
Patient here with complaints of asthma attack yesterday. Reports that he needs his albuterol neb inhalation medication refilled, last one used yesterday. No distress today.
